# Patient Record
Sex: Female | Born: 1950 | Race: Black or African American | Hispanic: No | Marital: Married
Health system: Southern US, Community
[De-identification: ages and names within clinical notes are randomized; demographics above are authoritative.]

## PROBLEM LIST (undated history)

## (undated) DIAGNOSIS — I10 Essential (primary) hypertension: Secondary | ICD-10-CM

---

## 2005-05-16 ENCOUNTER — Ambulatory Visit: Payer: Self-pay | Admitting: Obstetrics and Gynecology

## 2006-05-18 ENCOUNTER — Ambulatory Visit: Payer: Self-pay | Admitting: Obstetrics and Gynecology

## 2007-05-22 ENCOUNTER — Ambulatory Visit: Payer: Self-pay | Admitting: Obstetrics and Gynecology

## 2008-05-26 ENCOUNTER — Ambulatory Visit: Payer: Self-pay | Admitting: Obstetrics and Gynecology

## 2009-06-02 ENCOUNTER — Ambulatory Visit: Payer: Self-pay | Admitting: Obstetrics and Gynecology

## 2010-06-14 ENCOUNTER — Ambulatory Visit: Payer: Self-pay | Admitting: Obstetrics and Gynecology

## 2011-06-15 ENCOUNTER — Ambulatory Visit: Payer: Self-pay | Admitting: Obstetrics and Gynecology

## 2012-06-18 ENCOUNTER — Ambulatory Visit: Payer: Self-pay | Admitting: Obstetrics and Gynecology

## 2013-06-23 ENCOUNTER — Ambulatory Visit: Payer: Self-pay | Admitting: Obstetrics and Gynecology

## 2014-01-26 ENCOUNTER — Ambulatory Visit: Payer: Self-pay | Admitting: Unknown Physician Specialty

## 2014-05-18 LAB — SURGICAL PATHOLOGY

## 2014-06-03 ENCOUNTER — Other Ambulatory Visit: Payer: Self-pay | Admitting: Internal Medicine

## 2014-06-03 DIAGNOSIS — Z1231 Encounter for screening mammogram for malignant neoplasm of breast: Secondary | ICD-10-CM

## 2014-06-25 ENCOUNTER — Ambulatory Visit
Admission: RE | Admit: 2014-06-25 | Discharge: 2014-06-25 | Disposition: A | Discharge status: Home / Self Care | Payer: Medicare Other | Source: Ambulatory Visit | Attending: Internal Medicine | Admitting: Internal Medicine

## 2014-06-25 DIAGNOSIS — R922 Inconclusive mammogram: Secondary | ICD-10-CM | POA: Diagnosis not present

## 2014-06-25 DIAGNOSIS — Z1231 Encounter for screening mammogram for malignant neoplasm of breast: Secondary | ICD-10-CM | POA: Insufficient documentation

## 2015-05-14 ENCOUNTER — Other Ambulatory Visit: Payer: Self-pay | Admitting: Internal Medicine

## 2015-05-14 DIAGNOSIS — Z1231 Encounter for screening mammogram for malignant neoplasm of breast: Secondary | ICD-10-CM

## 2015-06-28 ENCOUNTER — Other Ambulatory Visit: Payer: Self-pay | Admitting: Internal Medicine

## 2015-06-28 ENCOUNTER — Ambulatory Visit
Admission: RE | Admit: 2015-06-28 | Discharge: 2015-06-28 | Disposition: A | Discharge status: Home / Self Care | Payer: Medicare Other | Source: Ambulatory Visit | Attending: Internal Medicine | Admitting: Internal Medicine

## 2015-06-28 DIAGNOSIS — Z1231 Encounter for screening mammogram for malignant neoplasm of breast: Secondary | ICD-10-CM

## 2016-05-15 ENCOUNTER — Other Ambulatory Visit: Payer: Self-pay | Admitting: Internal Medicine

## 2016-05-15 DIAGNOSIS — Z1231 Encounter for screening mammogram for malignant neoplasm of breast: Secondary | ICD-10-CM

## 2016-06-06 ENCOUNTER — Ambulatory Visit: Payer: Medicare Other

## 2016-06-15 ENCOUNTER — Ambulatory Visit
Admission: RE | Admit: 2016-06-15 | Discharge: 2016-06-15 | Disposition: A | Discharge status: Home / Self Care | Payer: Medicare Other | Source: Ambulatory Visit | Attending: Internal Medicine | Admitting: Internal Medicine

## 2016-06-15 DIAGNOSIS — Z1231 Encounter for screening mammogram for malignant neoplasm of breast: Secondary | ICD-10-CM

## 2017-05-24 ENCOUNTER — Other Ambulatory Visit: Payer: Self-pay | Admitting: Internal Medicine

## 2017-05-24 DIAGNOSIS — Z1231 Encounter for screening mammogram for malignant neoplasm of breast: Secondary | ICD-10-CM

## 2017-06-20 ENCOUNTER — Ambulatory Visit
Admission: RE | Admit: 2017-06-20 | Discharge: 2017-06-20 | Disposition: A | Discharge status: Home / Self Care | Payer: Medicare Other | Source: Ambulatory Visit | Attending: Internal Medicine | Admitting: Internal Medicine

## 2017-06-20 DIAGNOSIS — Z1231 Encounter for screening mammogram for malignant neoplasm of breast: Secondary | ICD-10-CM | POA: Diagnosis not present

## 2018-04-04 ENCOUNTER — Other Ambulatory Visit: Payer: Self-pay | Admitting: Obstetrics and Gynecology

## 2018-04-04 DIAGNOSIS — M8580 Other specified disorders of bone density and structure, unspecified site: Secondary | ICD-10-CM

## 2018-04-04 DIAGNOSIS — Z1231 Encounter for screening mammogram for malignant neoplasm of breast: Secondary | ICD-10-CM

## 2018-06-25 ENCOUNTER — Ambulatory Visit
Admission: RE | Admit: 2018-06-25 | Discharge: 2018-06-25 | Disposition: A | Discharge status: Home / Self Care | Payer: Medicare Other | Source: Ambulatory Visit | Attending: Obstetrics and Gynecology | Admitting: Obstetrics and Gynecology

## 2018-06-25 ENCOUNTER — Other Ambulatory Visit: Payer: Self-pay

## 2018-06-25 DIAGNOSIS — Z1231 Encounter for screening mammogram for malignant neoplasm of breast: Secondary | ICD-10-CM | POA: Diagnosis present

## 2018-06-25 DIAGNOSIS — M8580 Other specified disorders of bone density and structure, unspecified site: Secondary | ICD-10-CM | POA: Insufficient documentation

## 2018-06-25 DIAGNOSIS — Z780 Asymptomatic menopausal state: Secondary | ICD-10-CM | POA: Diagnosis not present

## 2019-04-17 ENCOUNTER — Other Ambulatory Visit: Payer: Self-pay | Admitting: Internal Medicine

## 2019-04-17 DIAGNOSIS — Z1231 Encounter for screening mammogram for malignant neoplasm of breast: Secondary | ICD-10-CM

## 2019-06-26 ENCOUNTER — Ambulatory Visit
Admission: RE | Admit: 2019-06-26 | Discharge: 2019-06-26 | Disposition: A | Discharge status: Home / Self Care | Payer: Medicare Other | Source: Ambulatory Visit | Attending: Internal Medicine | Admitting: Internal Medicine

## 2019-06-26 DIAGNOSIS — Z1231 Encounter for screening mammogram for malignant neoplasm of breast: Secondary | ICD-10-CM | POA: Insufficient documentation

## 2020-05-12 ENCOUNTER — Other Ambulatory Visit: Payer: Self-pay | Admitting: Internal Medicine

## 2020-05-12 DIAGNOSIS — Z1231 Encounter for screening mammogram for malignant neoplasm of breast: Secondary | ICD-10-CM

## 2020-06-29 ENCOUNTER — Ambulatory Visit
Admission: RE | Admit: 2020-06-29 | Discharge: 2020-06-29 | Disposition: A | Discharge status: Home / Self Care | Payer: Medicare Other | Source: Ambulatory Visit | Attending: Internal Medicine | Admitting: Internal Medicine

## 2020-06-29 ENCOUNTER — Other Ambulatory Visit: Payer: Self-pay

## 2020-06-29 DIAGNOSIS — Z1231 Encounter for screening mammogram for malignant neoplasm of breast: Secondary | ICD-10-CM

## 2020-09-29 ENCOUNTER — Emergency Department: Payer: Medicare Other

## 2020-09-29 ENCOUNTER — Encounter: Payer: Self-pay | Admitting: Emergency Medicine

## 2020-09-29 ENCOUNTER — Emergency Department
Admission: EM | Admit: 2020-09-29 | Discharge: 2020-09-29 | Disposition: A | Discharge status: Home / Self Care | Payer: Medicare Other | Attending: Emergency Medicine | Admitting: Emergency Medicine

## 2020-09-29 ENCOUNTER — Other Ambulatory Visit: Payer: Self-pay

## 2020-09-29 DIAGNOSIS — I10 Essential (primary) hypertension: Secondary | ICD-10-CM | POA: Insufficient documentation

## 2020-09-29 DIAGNOSIS — R102 Pelvic and perineal pain: Secondary | ICD-10-CM | POA: Diagnosis present

## 2020-09-29 DIAGNOSIS — R1030 Lower abdominal pain, unspecified: Secondary | ICD-10-CM | POA: Insufficient documentation

## 2020-09-29 DIAGNOSIS — R319 Hematuria, unspecified: Secondary | ICD-10-CM | POA: Insufficient documentation

## 2020-09-29 HISTORY — DX: Essential (primary) hypertension: I10

## 2020-09-29 LAB — URINALYSIS, COMPLETE (UACMP) WITH MICROSCOPIC
Bacteria, UA: NONE SEEN
Bilirubin Urine: NEGATIVE
Glucose, UA: NEGATIVE mg/dL
Ketones, ur: 15 mg/dL — AB
Leukocytes,Ua: NEGATIVE
Nitrite: NEGATIVE
RBC / HPF: >50 RBC/hpf (ref 0–5)
Specific Gravity, Urine: 1.025 (ref 1.005–1.030)
pH: 6.5 (ref 5.0–8.0)

## 2020-09-29 LAB — COMPREHENSIVE METABOLIC PANEL
ALT: 34 U/L (ref 0–44)
AST: 37 U/L (ref 15–41)
Albumin: 4.4 g/dL (ref 3.5–5.0)
Alkaline Phosphatase: 67 U/L (ref 38–126)
Anion gap: 9 (ref 5–15)
BUN: 7 mg/dL — ABNORMAL LOW (ref 8–23)
CO2: 25 mmol/L (ref 22–32)
Calcium: 9.5 mg/dL (ref 8.9–10.3)
Chloride: 101 mmol/L (ref 98–111)
Creatinine, Ser: 0.54 mg/dL (ref 0.44–1.00)
GFR, Estimated: >60 mL/min (ref >60)
Glucose, Bld: 105 mg/dL — ABNORMAL HIGH (ref 70–99)
Potassium: 3.2 mmol/L — ABNORMAL LOW (ref 3.5–5.1)
Sodium: 135 mmol/L (ref 135–145)
Total Bilirubin: 0.7 mg/dL (ref 0.3–1.2)
Total Protein: 7.5 g/dL (ref 6.5–8.1)

## 2020-09-29 LAB — LIPASE, BLOOD: Lipase: 24 U/L (ref 11–51)

## 2020-09-29 LAB — CBC
HCT: 35.5 % — ABNORMAL LOW (ref 36.0–46.0)
Hemoglobin: 12.6 g/dL (ref 12.0–15.0)
MCH: 31.4 pg (ref 26.0–34.0)
MCHC: 35.5 g/dL (ref 30.0–36.0)
MCV: 88.5 fL (ref 80.0–100.0)
Platelets: 237 10*3/uL (ref 150–400)
RBC: 4.01 MIL/uL (ref 3.87–5.11)
RDW: 12.2 % (ref 11.5–15.5)
WBC: 5.8 10*3/uL (ref 4.0–10.5)
nRBC: 0 % (ref 0.0–0.2)

## 2020-09-29 MED ORDER — IBUPROFEN 600 MG PO TABS
600.0000 mg | ORAL_TABLET | Freq: Once | ORAL | Status: AC
  Administered 2020-09-29: 600 mg via ORAL
  Filled 2020-09-29: qty 1

## 2020-09-29 NOTE — ED Triage Notes (Signed)
First Nurse Note:  Arrives c/o lower abdominal pain since early this morning.  AAOx3.  Skin warm and dry. NAD

## 2020-09-29 NOTE — Discharge Instructions (Addendum)
Please follow-up with your primary care doctor.  Show him the CT report that I have given you.  He likely will either refer you to [facility] or order the MRI of the kidney to check on the cyst with calcium in it himself.  If that turns out to be nothing serious you will still have to see urology to get the blood in the urine evaluated.  Please return here for return of the pain or fever or vomiting or if you feel sicker.

## 2020-09-29 NOTE — ED Triage Notes (Signed)
Pt reports that she has had lower abd pain starting this am. She denies any N/V/D. Last BM was this am and states that if she could have another one she may feel better.

## 2020-09-29 NOTE — ED Provider Notes (Signed)
[facility] Emergency Department Provider Note  ____________________________________________   Event Date/Time   First MD Initiated Contact with Patient 09/29/20 2126     (approximate)  I have reviewed the triage vital signs and the nursing notes.   HISTORY  Chief Complaint Abdominal Pain    HPI LANAYAH GARTLEY is a 70 y.o. female patient reports she woke up this morning with pain just above the pubic area.  It was achy and moderately severe.  She has never had it before.  The pain did not radiate or move.  She is not had any fever or nausea or vomiting.  She has not had any dysuria.  She does have blood in her urine but has had blood in her urine for some time.  Nothing seem to make the pain better or worse although it improved markedly while she was here and then resolved after taking some Motrin.        Past Medical History:  Diagnosis Date   Hypertension     There are no problems to display for this patient.   History reviewed. No pertinent surgical history.  Prior to Admission medications   Not on File    Allergies Patient has no known allergies.  Family History  Problem Relation Age of Onset   Breast cancer Paternal Grandmother     Social History    Review of Systems  Constitutional: No fever/chills Eyes: No visual changes. ENT: No sore throat. Cardiovascular: Denies chest pain. Respiratory: Denies shortness of breath. Gastrointestinal: No abdominal pain.  No nausea, no vomiting.  No diarrhea.  No constipation. Genitourinary: Negative for dysuria. Musculoskeletal: Negative for back pain. Skin: Negative for rash. Neurological: Negative for headaches, focal weakness   ____________________________________________   PHYSICAL EXAM:  VITAL SIGNS: ED Triage Vitals  Enc Vitals Group     BP 09/29/20 1753 (!) 176/89     Pulse Rate 09/29/20 1753 (!) 108     Resp 09/29/20 1753 20     Temp 09/29/20 1753 99 F (37.2 C)      Temp Source 09/29/20 1753 Oral     SpO2 09/29/20 1753 96 %     Weight 09/29/20 1752 192 lb (87.1 kg)     Height 09/29/20 1752 5\' 5"  (1.651 m)     Head Circumference --      Peak Flow --      Pain Score 09/29/20 1801 8     Pain Loc --      Pain Edu? --      Excl. in GC? --     Constitutional: Alert and oriented. Well appearing and in no acute distress. Eyes: Conjunctivae are normal. Head: Atraumatic. Nose: No congestion/rhinnorhea. Mouth/Throat: Mucous membranes are moist.  Oropharynx non-erythematous. Neck: No stridor.  Cardiovascular: Normal rate, regular rhythm. Grossly normal heart sounds.  Good peripheral circulation. Respiratory: Normal respiratory effort.  No retractions. Lungs CTAB. Gastrointestinal: Soft and nontender. No distention. No abdominal bruits. No CVA tenderness. Musculoskeletal: No lower extremity tenderness nor edema.   Neurologic:  Normal speech and language. No gross focal neurologic deficits are appreciated. No gait instability. Skin:  Skin is warm, dry and intact. No rash noted.   ____________________________________________   LABS (all labs ordered are listed, but only abnormal results are displayed)  Labs Reviewed  COMPREHENSIVE METABOLIC PANEL - Abnormal; Notable for the following components:      Result Value   Potassium 3.2 (*)    Glucose, Bld 105 (*)  BUN 7 (*)    All other components within normal limits  CBC - Abnormal; Notable for the following components:   HCT 35.5 (*)    All other components within normal limits  URINALYSIS, COMPLETE (UACMP) WITH MICROSCOPIC - Abnormal; Notable for the following components:   Hgb urine dipstick MODERATE (*)    Ketones, ur 15 (*)    Protein, ur TRACE (*)    All other components within normal limits  LIPASE, BLOOD   ____________________________________________  EKG   ____________________________________________  RADIOLOGY Jill Poling, personally viewed and evaluated these images  (plain radiographs) as part of my medical decision making, as well as reviewing the written report by the radiologist.  ED MD interpretation: CT showed a cyst in the area of the improved pubic symphysis and a cyst with some calcium at the top of the right kidney.  I discussed these with the radiologist.  The radiologist felt outpatient MRI would be a good work-up for the cyst.  It could possibly be a manifestation of renal cancer.  Official radiology report(s): CT Renal Stone Study  Result Date: 09/29/2020 CLINICAL DATA:  Hematuria, lower abdominal pain EXAM: CT ABDOMEN AND PELVIS WITHOUT CONTRAST TECHNIQUE: Multidetector CT imaging of the abdomen and pelvis was performed following the standard protocol without IV contrast. COMPARISON:  None. FINDINGS: Lower chest: The visualized lung bases are clear. Mild right coronary artery calcification. Global cardiac size within normal limits. Hepatobiliary: Multiple hepatic hypodensities seen within the subserosal left hepatic lobe and inferior right hepatic lobe may represent hepatic cysts or small hemangioma. These were described by report on prior CT examination of 03/13/2003 though those images are unavailable for direct comparison. No other focal intrahepatic mass is identified. No intra or extrahepatic biliary ductal dilation. Gallbladder unremarkable. 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 The pancreas is otherwise unremarkable. Spleen: Unremarkable Adrenals/Urinary Tract: The adrenal glands are unremarkable. The kidneys are normal in size and position. 4.6 cm simple cortical cyst noted within the upper pole of the left kidney. 9 mm cortical calcification noted within the upper pole the right kidney, nonspecific, possibly related to remote infection or inflammation. A small calcified mass, however, is difficult to exclude on this noncontrast examination. No hydronephrosis. No intrarenal or ureteral calculi. The bladder is  unremarkable. Stomach/Bowel: The stomach, small bowel, and large bowel are unremarkable. Appendix normal. No free intraperitoneal gas or fluid. Vascular/Lymphatic: Mild aortoiliac atherosclerotic calcification. No aortic aneurysm. No pathologic adenopathy within the abdomen and pelvis. Reproductive: Uterus and bilateral adnexa are unremarkable. Other: Small bilateral fat containing inguinal hernias and moderate fat containing umbilical hernia are noted. Musculoskeletal: Degenerative changes are seen at the pubic symphysis. Additionally, a 3.6 cm hyperdense cystic lesion arises from the pubic symphysis and extends superiorly and anteriorly to the bladder compatible with a suprapubic cartilaginous cyst. No acute bone abnormality. Degenerative changes are seen within the lumbar spine. No lytic or blastic bone lesions are identified. IMPRESSION: No acute intra-abdominal pathology. No definite radiographic explanation for the patient's reported symptoms. 9 mm focus of cortical calcification within the upper pole the right kidney, incompletely evaluated on this examination. Given its depth and position, in absence of prior examinations to confirm stability, dedicated CT or MRI examination would be helpful to exclude an underlying partially calcified mass. 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 Mild right coronary artery calcification. Moderate umbilical and small bilateral inguinal fat containing hernias. Aortic  Atherosclerosis (ICD10-I70.0). Electronically Signed   By: Helyn Numbers M.D.   On: 09/29/2020 22:23    ____________________________________________   PROCEDURES  Procedure(s) performed (including Critical Care):  Procedures   ____________________________________________   INITIAL IMPRESSION / ASSESSMENT AND PLAN / ED COURSE    I discussed the patient at length with Dr. Okey Dupre on-call for orthopedics.  He feels  the pubic area cyst is not a new development.  He can work on that in the office as an outpatient.  The patient's pain had improved and in fact resolved by this time.  I discussed with her the CT findings and need for MRI.  Dr. Graciela Husbands is her doctor.  He will easily be able to access the records from this visit.  He should be able to order the MRI without any difficulty.  I will have the patient follow-up with him.  If need be she can also follow-up with Dr. Apolinar Junes urology.  I have given her the information about the cyst.  She can also follow-up with Dr. Okey Dupre if need be.  The fact that the pain came on today and this is likely had been there for some time makes me feel that she may not need anything done about the cyst.             ____________________________________________   FINAL CLINICAL IMPRESSION(S) / ED DIAGNOSES  Final diagnoses:  Lower abdominal pain  Hematuria, unspecified type     ED Discharge Orders     None        Note:  This document was prepared using Dragon voice recognition software and may include unintentional dictation errors.    Arnaldo Natal, MD 09/30/20 [phone]

## 2020-10-05 ENCOUNTER — Other Ambulatory Visit: Payer: Self-pay | Admitting: Internal Medicine

## 2020-10-05 DIAGNOSIS — N2889 Other specified disorders of kidney and ureter: Secondary | ICD-10-CM

## 2020-10-05 DIAGNOSIS — I10 Essential (primary) hypertension: Secondary | ICD-10-CM

## 2020-10-21 ENCOUNTER — Ambulatory Visit
Admission: RE | Admit: 2020-10-21 | Discharge: 2020-10-21 | Disposition: A | Discharge status: Home / Self Care | Payer: Medicare Other | Source: Ambulatory Visit | Attending: Internal Medicine | Admitting: Internal Medicine

## 2020-10-21 DIAGNOSIS — I10 Essential (primary) hypertension: Secondary | ICD-10-CM | POA: Diagnosis present

## 2020-10-21 DIAGNOSIS — N2889 Other specified disorders of kidney and ureter: Secondary | ICD-10-CM | POA: Insufficient documentation

## 2020-10-21 MED ORDER — GADOBUTROL 1 MMOL/ML IV SOLN
7.0000 mL | Freq: Once | INTRAVENOUS | Status: AC | PRN
  Administered 2020-10-21: 7 mL via INTRAVENOUS

## 2021-04-12 ENCOUNTER — Other Ambulatory Visit: Payer: Self-pay | Admitting: Internal Medicine

## 2021-04-12 DIAGNOSIS — Z1231 Encounter for screening mammogram for malignant neoplasm of breast: Secondary | ICD-10-CM

## 2021-06-30 ENCOUNTER — Ambulatory Visit
Admission: RE | Admit: 2021-06-30 | Discharge: 2021-06-30 | Disposition: A | Discharge status: Home / Self Care | Payer: Medicare Other | Source: Ambulatory Visit | Attending: Internal Medicine | Admitting: Internal Medicine

## 2021-06-30 DIAGNOSIS — Z1231 Encounter for screening mammogram for malignant neoplasm of breast: Secondary | ICD-10-CM | POA: Insufficient documentation

## 2022-05-15 ENCOUNTER — Other Ambulatory Visit: Payer: Self-pay | Admitting: Internal Medicine

## 2022-05-15 DIAGNOSIS — Z1231 Encounter for screening mammogram for malignant neoplasm of breast: Secondary | ICD-10-CM

## 2022-07-03 ENCOUNTER — Ambulatory Visit
Admission: RE | Admit: 2022-07-03 | Discharge: 2022-07-03 | Disposition: A | Discharge status: Home / Self Care | Payer: Medicare Other | Source: Ambulatory Visit | Attending: Internal Medicine | Admitting: Internal Medicine

## 2022-07-03 DIAGNOSIS — Z1231 Encounter for screening mammogram for malignant neoplasm of breast: Secondary | ICD-10-CM | POA: Diagnosis present

## 2023-04-20 ENCOUNTER — Other Ambulatory Visit: Payer: Self-pay | Admitting: Internal Medicine

## 2023-04-20 DIAGNOSIS — Z1231 Encounter for screening mammogram for malignant neoplasm of breast: Secondary | ICD-10-CM

## 2023-07-04 ENCOUNTER — Ambulatory Visit
Admission: RE | Admit: 2023-07-04 | Discharge: 2023-07-04 | Disposition: A | Discharge status: Home / Self Care | Source: Ambulatory Visit | Attending: Internal Medicine | Admitting: Internal Medicine

## 2023-07-04 DIAGNOSIS — Z1231 Encounter for screening mammogram for malignant neoplasm of breast: Secondary | ICD-10-CM | POA: Diagnosis present

## 2024-04-04 IMAGING — MG MM DIGITAL SCREENING BILAT W/ TOMO AND CAD
8 series · 8 of 24 positions shown · non-contrast
Comparison: Previous exam(s).

CLINICAL DATA: Screening.

EXAM:
DIGITAL SCREENING BILATERAL MAMMOGRAM WITH TOMOSYNTHESIS AND CAD
TECHNIQUE: Bilateral screening digital craniocaudal and mediolateral oblique
mammograms were obtained. Bilateral screening digital breast
tomosynthesis was performed. The images were evaluated with
computer-aided detection.

[L CC synth-2D]
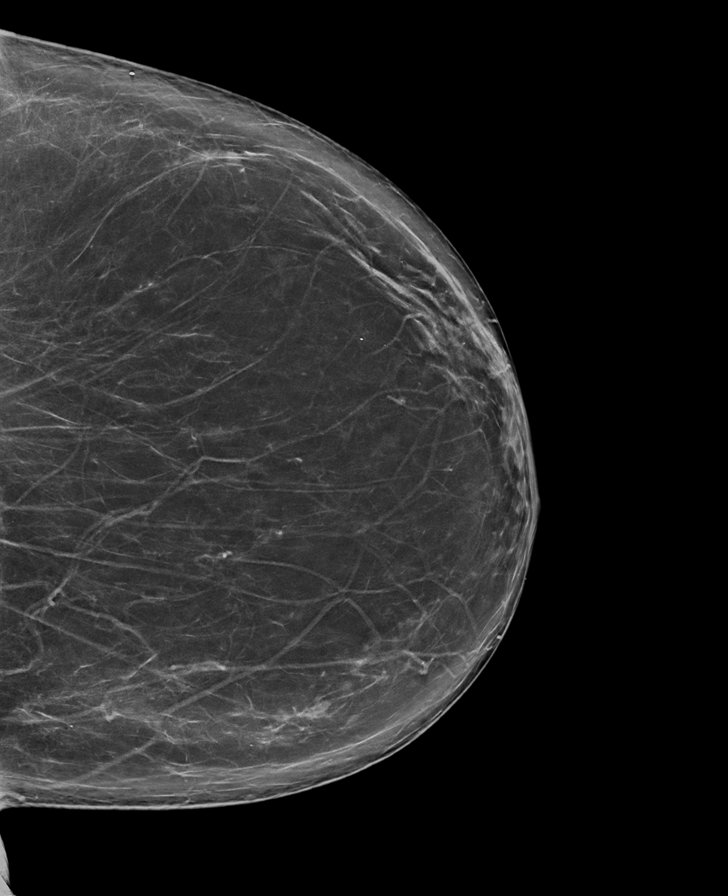

[R CC synth-2D]
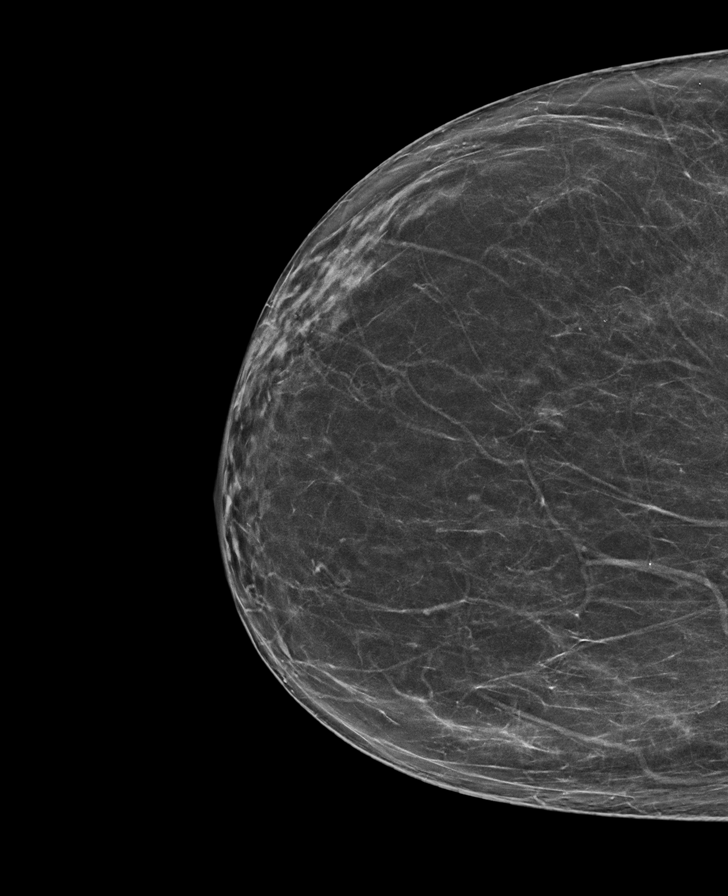

[L MLO synth-2D]
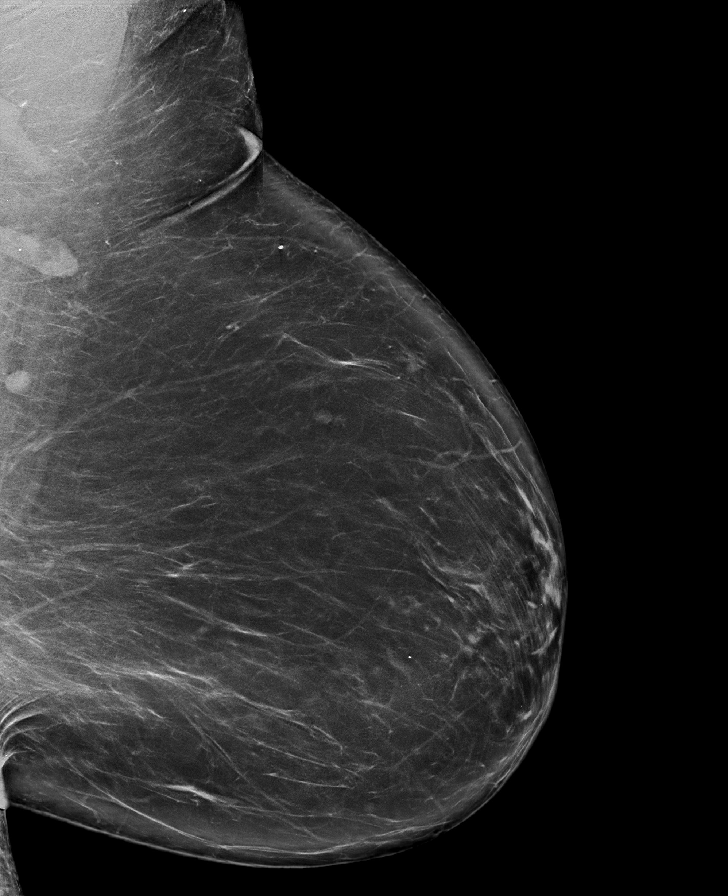

[R MLO synth-2D]
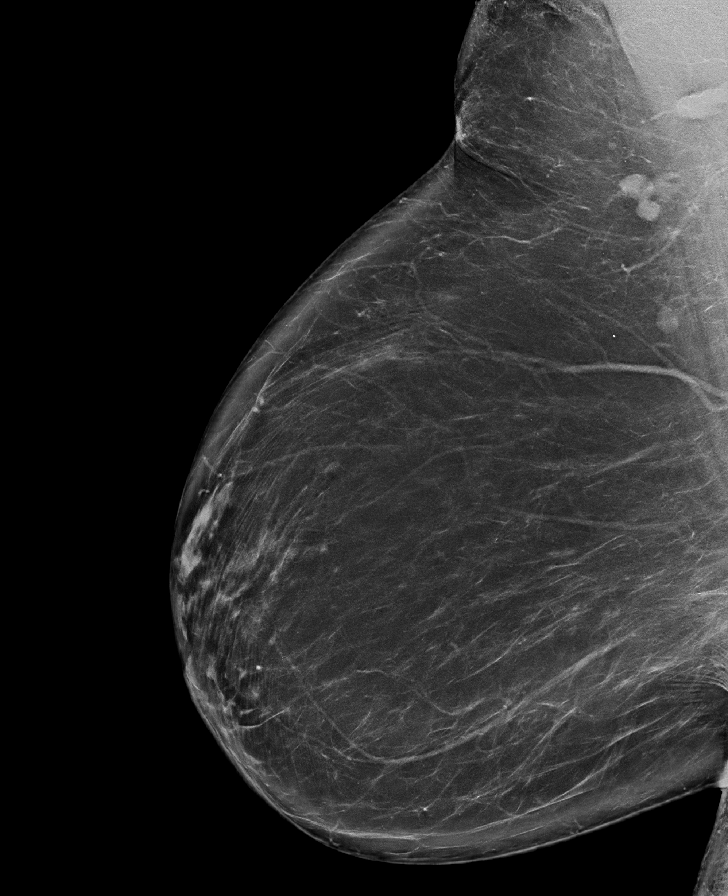

[L CC tomo · tomo slice 47/92.0]
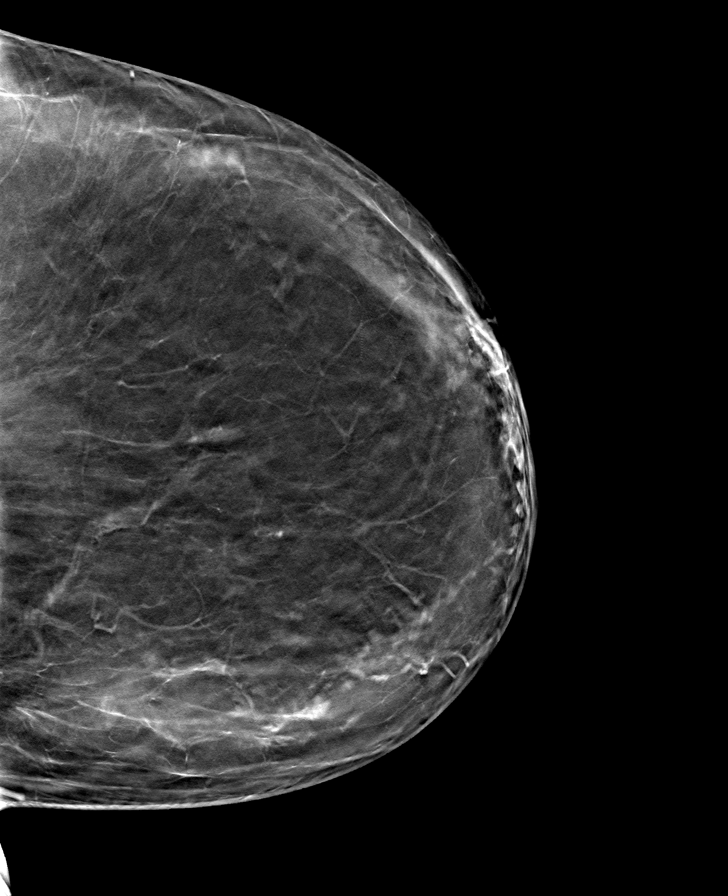

[R MLO tomo · tomo slice 51/100.0]
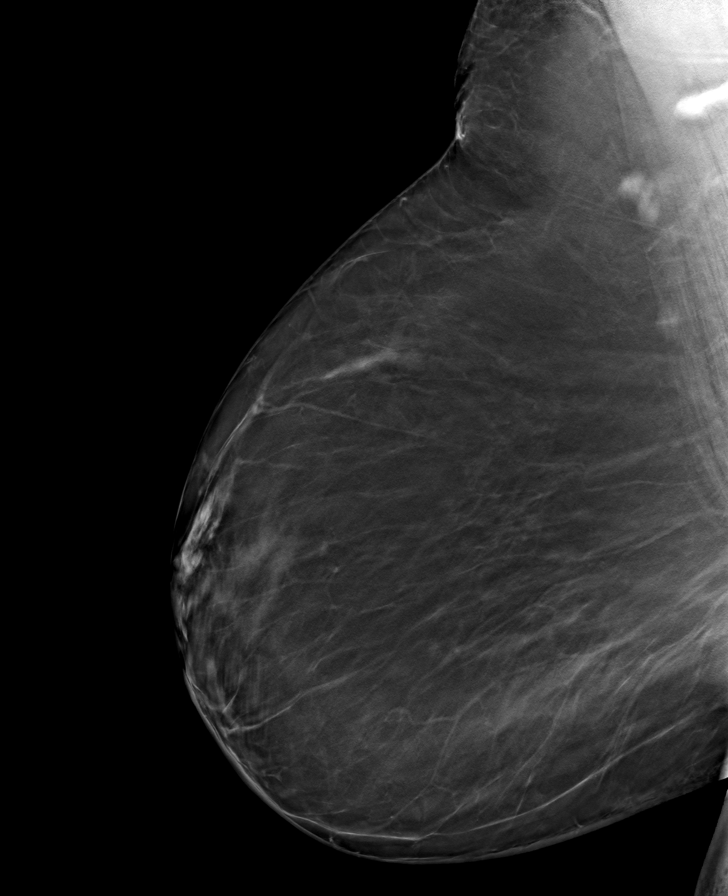

[R CC tomo · tomo slice 38/75.0]
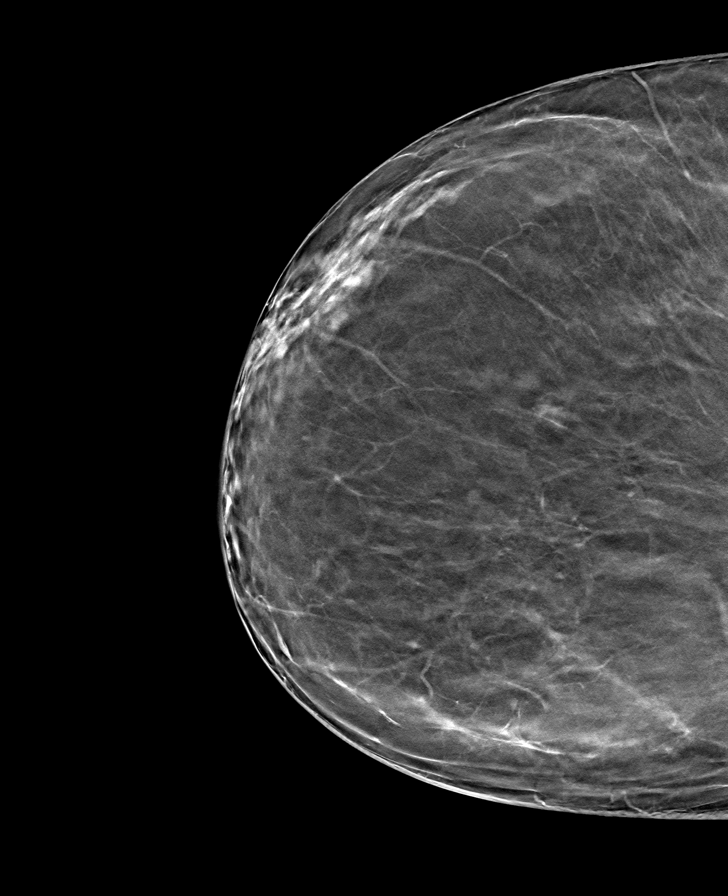

[L MLO tomo · tomo slice 51/100.0]
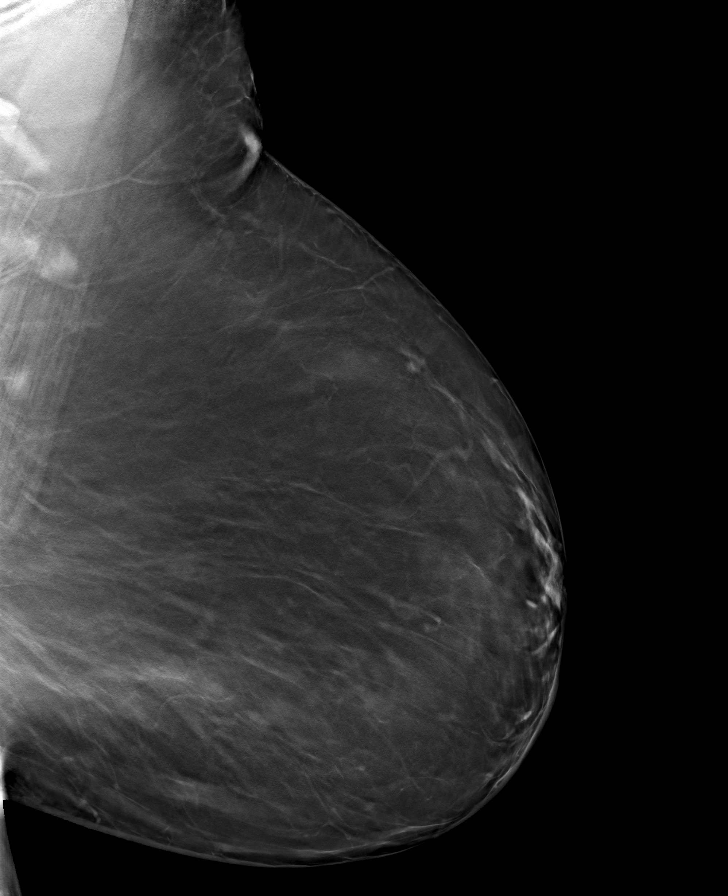

[8 of 24 positions shown; findings below may reference images not displayed]

ACR Breast Density Category b: There are scattered areas of
fibroglandular density.
FINDINGS: There are no findings suspicious for malignancy.
IMPRESSION: No mammographic evidence of malignancy. A result letter of this
screening mammogram will be mailed directly to the patient.

RECOMMENDATION:
Screening mammogram in one year. (Code:51-O-LD2)

BI-RADS CATEGORY  1: Negative.
